# Patient Record
Sex: Male | Born: 1950 | Race: White | Hispanic: No | Marital: Married | Smoking: Never smoker
Health system: Southern US, Community
[De-identification: ages and names within clinical notes are randomized; demographics above are authoritative.]

## PROBLEM LIST (undated history)

## (undated) DIAGNOSIS — M109 Gout, unspecified: Secondary | ICD-10-CM

## (undated) DIAGNOSIS — N281 Cyst of kidney, acquired: Secondary | ICD-10-CM

## (undated) DIAGNOSIS — N401 Enlarged prostate with lower urinary tract symptoms: Secondary | ICD-10-CM

## (undated) DIAGNOSIS — E785 Hyperlipidemia, unspecified: Secondary | ICD-10-CM

## (undated) DIAGNOSIS — I440 Atrioventricular block, first degree: Secondary | ICD-10-CM

## (undated) DIAGNOSIS — I480 Paroxysmal atrial fibrillation: Secondary | ICD-10-CM

## (undated) DIAGNOSIS — I10 Essential (primary) hypertension: Secondary | ICD-10-CM

## (undated) HISTORY — PX: INGUINAL HERNIA REPAIR: SUR1180

## (undated) HISTORY — PX: CARDIAC ELECTROPHYSIOLOGY STUDY AND ABLATION: SHX1294

---
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
## 2013-08-28 ENCOUNTER — Other Ambulatory Visit: Payer: Self-pay | Admitting: Internal Medicine

## 2013-08-28 LAB — BASIC METABOLIC PANEL
Anion Gap: 3 — ABNORMAL LOW (ref 7–16)
Calcium, Total: 9.3 mg/dL (ref 8.5–10.1)
Chloride: 100 mmol/L (ref 98–107)
EGFR (Non-African Amer.): >60
Glucose: 109 mg/dL — ABNORMAL HIGH (ref 65–99)
Osmolality: 277 (ref 275–301)
Potassium: 4.5 mmol/L (ref 3.5–5.1)
Sodium: 137 mmol/L (ref 136–145)

## 2013-08-30 ENCOUNTER — Ambulatory Visit: Payer: Self-pay | Admitting: Internal Medicine

## 2016-03-12 ENCOUNTER — Encounter (INDEPENDENT_AMBULATORY_CARE_PROVIDER_SITE_OTHER): Payer: Self-pay | Admitting: Family Medicine

## 2016-03-12 DIAGNOSIS — Z0289 Encounter for other administrative examinations: Secondary | ICD-10-CM

## 2016-03-12 NOTE — Progress Notes (Unsigned)
FAA class 3 PE

## 2018-03-14 ENCOUNTER — Encounter: Payer: Self-pay | Admitting: Family Medicine

## 2018-03-14 ENCOUNTER — Ambulatory Visit: Payer: Self-pay | Admitting: Family Medicine

## 2018-03-14 VITALS — BP 138/80 | HR 72 | Ht 72.75 in | Wt 195.0 lb

## 2018-03-14 DIAGNOSIS — Z029 Encounter for administrative examinations, unspecified: Secondary | ICD-10-CM

## 2018-03-14 DIAGNOSIS — Z0289 Encounter for other administrative examinations: Secondary | ICD-10-CM

## 2018-03-14 NOTE — Progress Notes (Signed)
   BP 138/80 (BP Location: Left Arm)   Pulse 72   Ht 6' 0.75" (1.848 m)   Wt 195 lb (88.5 kg)   BMI 25.90 kg/m    Subjective:    Patient ID: Marcus Olsen, male    DOB: [birth year], 67 y.o.   MRN: [id number]  HPI: Marcus Olsen is a 67 y.o. male  Chief Complaint  Patient presents with  . Annual Exam    FFA Class 3    Doing well complaints from medications takes without problems good control of blood pressure reviewed notes from Dr. Doy Hutching in [place] with good control. Further atrial fibrillation which is completely resolved with no indications for treatments on cardiology review. Relevant past medical, surgical, family and social history reviewed and updated as indicated. Interim medical history since our last visit reviewed. Allergies and medications reviewed and updated.  Review of Systems  Constitutional: Negative.   HENT: Negative.   Eyes: Negative.   Respiratory: Negative.   Cardiovascular: Negative.   Gastrointestinal: Negative.   Endocrine: Negative.   Genitourinary: Negative.   Musculoskeletal: Negative.   Skin: Negative.   Allergic/Immunologic: Negative.   Neurological: Negative.   Hematological: Negative.   Psychiatric/Behavioral: Negative.     Per HPI unless specifically indicated above     Objective:    BP 138/80 (BP Location: Left Arm)   Pulse 72   Ht 6' 0.75" (1.848 m)   Wt 195 lb (88.5 kg)   BMI 25.90 kg/m   Wt Readings from Last 3 Encounters:  03/14/18 195 lb (88.5 kg)  03/12/16 200 lb (90.7 kg)    Physical Exam  Constitutional: He is oriented to person, place, and time. He appears well-developed and well-nourished.  HENT:  Head: Normocephalic.  Right Ear: External ear normal.  Left Ear: External ear normal.  Nose: Nose normal.  Eyes: Pupils are equal, round, and reactive to light. Conjunctivae and EOM are normal.  Neck: Normal range of motion. Neck supple. No thyromegaly present.  Cardiovascular: Normal rate, regular rhythm, normal  heart sounds and intact distal pulses.  Pulmonary/Chest: Effort normal and breath sounds normal.  Abdominal: Soft. Bowel sounds are normal. There is no splenomegaly or hepatomegaly.  Genitourinary: Penis normal.  Musculoskeletal: Normal range of motion.  Lymphadenopathy:    He has no cervical adenopathy.  Neurological: He is alert and oriented to person, place, and time. He has normal reflexes.  Skin: Skin is warm and dry.  Psychiatric: He has a normal mood and affect. His behavior is normal. Judgment and thought content normal.    No results found for this or any previous visit.    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

## 2018-08-17 ENCOUNTER — Other Ambulatory Visit: Payer: Self-pay

## 2018-08-17 DIAGNOSIS — E782 Mixed hyperlipidemia: Secondary | ICD-10-CM

## 2018-08-17 NOTE — Progress Notes (Signed)
Dr. Nehemiah Massed patient, order placed for Dr. Johnsie Cancel to read.

## 2019-06-14 ENCOUNTER — Telehealth: Payer: Self-pay

## 2019-06-14 ENCOUNTER — Other Ambulatory Visit: Payer: Self-pay

## 2019-06-14 DIAGNOSIS — Z1211 Encounter for screening for malignant neoplasm of colon: Secondary | ICD-10-CM

## 2019-06-14 DIAGNOSIS — Z86010 Personal history of colonic polyps: Secondary | ICD-10-CM

## 2019-06-14 MED ORDER — NA SULFATE-K SULFATE-MG SULF 17.5-3.13-1.6 GM/177ML PO SOLN: 1.0000 | Freq: Once | ORAL | 0 refills | Status: AC

## 2019-06-14 NOTE — Telephone Encounter (Signed)
Gastroenterology Pre-Procedure Review  Request Date: 06/30/19 Requesting Physician: Dr. Allen Norris  PATIENT REVIEW QUESTIONS: The patient responded to the following health history questions as indicated:    1. Are you having any GI issues? no 2. Do you have a personal history of Polyps? yes (5 years ago at [facility]) 3. Do you have a family history of Colon Cancer or Polyps? no 4. Diabetes Mellitus? no 5. Joint replacements in the past 12 months?no 6. Major health problems in the past 3 months?no 7. Any artificial heart valves, MVP, or defibrillator?Patient does have history of AFIB-Cardiac Clearance faxed to Dr. Nehemiah Massed    MEDICATIONS & ALLERGIES:    Patient reports the following regarding taking any anticoagulation/antiplatelet therapy:   Plavix, Coumadin, Eliquis, Xarelto, Lovenox, Pradaxa, Brilinta, or Effient? no Aspirin? no  Patient confirms/reports the following medications:  No current outpatient medications on file.   No current facility-administered medications for this visit.     Patient confirms/reports the following allergies:  Not on File  No orders of the defined types were placed in this encounter.   AUTHORIZATION INFORMATION Primary Insurance: 1D#: Group #:  Secondary Insurance: 1D#: Group #:  SCHEDULE INFORMATION: Date: 06/30/19 Time: Location:[facility]

## 2019-06-26 ENCOUNTER — Telehealth: Payer: Self-pay | Admitting: Gastroenterology

## 2019-06-26 ENCOUNTER — Encounter: Payer: Self-pay | Admitting: *Deleted

## 2019-06-26 ENCOUNTER — Other Ambulatory Visit: Payer: Self-pay

## 2019-06-26 MED ORDER — NA SULFATE-K SULFATE-MG SULF 17.5-3.13-1.6 GM/177ML PO SOLN: 354.0000 mL | Freq: Once | ORAL | 0 refills | Status: AC

## 2019-06-26 NOTE — Progress Notes (Signed)
[facility] states [facility] does not [facility] the [facility] procedure. [facility] [place]

## 2019-06-26 NOTE — Telephone Encounter (Signed)
Suprep rx has been sent to pt's pharmacy.

## 2019-06-26 NOTE — Telephone Encounter (Signed)
Pt wife lefty vm pt has not received his prescription for procedure on Friday please call pt

## 2019-06-27 ENCOUNTER — Other Ambulatory Visit
Admission: RE | Admit: 2019-06-27 | Discharge: 2019-06-27 | Disposition: A | Discharge status: Home / Self Care | Payer: Medicare Other | Source: Ambulatory Visit | Attending: Gastroenterology | Admitting: Gastroenterology

## 2019-06-27 DIAGNOSIS — Z20828 Contact with and (suspected) exposure to other viral communicable diseases: Secondary | ICD-10-CM | POA: Diagnosis not present

## 2019-06-27 DIAGNOSIS — Z01812 Encounter for preprocedural laboratory examination: Secondary | ICD-10-CM | POA: Diagnosis present

## 2019-06-28 ENCOUNTER — Telehealth: Payer: Self-pay

## 2019-06-28 LAB — SARS CORONAVIRUS 2 (TAT 6-24 HRS): SARS Coronavirus 2: NEGATIVE

## 2019-06-28 NOTE — Telephone Encounter (Signed)
Received cardiac clearance for patients colonoscopy scheduled with Dr. Allen Norris on 06/30/19.  Dr. Nehemiah Massed has granted clearance for colonoscopy.  Clearance note to be scanned to patients chart.  Thanks [employer]

## 2019-06-30 ENCOUNTER — Ambulatory Visit: Payer: Medicare Other | Admitting: Anesthesiology

## 2019-06-30 ENCOUNTER — Encounter
Admission: RE | Disposition: A | Discharge status: Home / Self Care | Payer: Self-pay | Source: Home / Self Care | Attending: Gastroenterology

## 2019-06-30 ENCOUNTER — Ambulatory Visit
Admission: RE | Admit: 2019-06-30 | Discharge: 2019-06-30 | Disposition: A | Discharge status: Home / Self Care | Payer: Medicare Other | Attending: Gastroenterology | Admitting: Gastroenterology

## 2019-06-30 DIAGNOSIS — M109 Gout, unspecified: Secondary | ICD-10-CM | POA: Insufficient documentation

## 2019-06-30 DIAGNOSIS — D125 Benign neoplasm of sigmoid colon: Secondary | ICD-10-CM | POA: Diagnosis not present

## 2019-06-30 DIAGNOSIS — E785 Hyperlipidemia, unspecified: Secondary | ICD-10-CM | POA: Insufficient documentation

## 2019-06-30 DIAGNOSIS — N401 Enlarged prostate with lower urinary tract symptoms: Secondary | ICD-10-CM | POA: Diagnosis not present

## 2019-06-30 DIAGNOSIS — Z79899 Other long term (current) drug therapy: Secondary | ICD-10-CM | POA: Insufficient documentation

## 2019-06-30 DIAGNOSIS — I480 Paroxysmal atrial fibrillation: Secondary | ICD-10-CM | POA: Insufficient documentation

## 2019-06-30 DIAGNOSIS — K5730 Diverticulosis of large intestine without perforation or abscess without bleeding: Secondary | ICD-10-CM | POA: Insufficient documentation

## 2019-06-30 DIAGNOSIS — I440 Atrioventricular block, first degree: Secondary | ICD-10-CM | POA: Insufficient documentation

## 2019-06-30 DIAGNOSIS — K640 First degree hemorrhoids: Secondary | ICD-10-CM | POA: Insufficient documentation

## 2019-06-30 DIAGNOSIS — Z860100 Personal history of colon polyps, unspecified: Secondary | ICD-10-CM

## 2019-06-30 DIAGNOSIS — Z7982 Long term (current) use of aspirin: Secondary | ICD-10-CM | POA: Insufficient documentation

## 2019-06-30 DIAGNOSIS — D124 Benign neoplasm of descending colon: Secondary | ICD-10-CM | POA: Insufficient documentation

## 2019-06-30 DIAGNOSIS — Z1211 Encounter for screening for malignant neoplasm of colon: Secondary | ICD-10-CM | POA: Insufficient documentation

## 2019-06-30 DIAGNOSIS — Q6101 Congenital single renal cyst: Secondary | ICD-10-CM | POA: Diagnosis not present

## 2019-06-30 DIAGNOSIS — R351 Nocturia: Secondary | ICD-10-CM | POA: Diagnosis not present

## 2019-06-30 DIAGNOSIS — K635 Polyp of colon: Secondary | ICD-10-CM

## 2019-06-30 DIAGNOSIS — I10 Essential (primary) hypertension: Secondary | ICD-10-CM | POA: Insufficient documentation

## 2019-06-30 HISTORY — PX: POLYPECTOMY: SHX5525

## 2019-06-30 HISTORY — DX: Atrioventricular block, first degree: I44.0

## 2019-06-30 HISTORY — DX: Essential (primary) hypertension: I10

## 2019-06-30 HISTORY — PX: COLONOSCOPY WITH PROPOFOL: SHX5780

## 2019-06-30 HISTORY — DX: Hyperlipidemia, unspecified: E78.5

## 2019-06-30 HISTORY — DX: Cyst of kidney, acquired: N28.1

## 2019-06-30 HISTORY — DX: Paroxysmal atrial fibrillation: I48.0

## 2019-06-30 HISTORY — DX: Benign prostatic hyperplasia with lower urinary tract symptoms: N40.1

## 2019-06-30 HISTORY — DX: Gout, unspecified: M10.9

## 2019-06-30 SURGERY — COLONOSCOPY WITH PROPOFOL
Anesthesia: General | Site: Rectum

## 2019-06-30 MED ORDER — STERILE WATER FOR IRRIGATION IR SOLN
Status: DC | PRN
  Administered 2019-06-30: .05 mL

## 2019-06-30 MED ORDER — LACTATED RINGERS IV SOLN
INTRAVENOUS | Status: DC
  Administered 2019-06-30: 12:00:00 via INTRAVENOUS

## 2019-06-30 MED ORDER — LIDOCAINE HCL (CARDIAC) PF 100 MG/5ML IV SOSY
PREFILLED_SYRINGE | INTRAVENOUS | Status: DC | PRN
  Administered 2019-06-30: 30 mg via INTRAVENOUS

## 2019-06-30 MED ORDER — PROPOFOL 10 MG/ML IV BOLUS
INTRAVENOUS | Status: DC | PRN
  Administered 2019-06-30 (×2): 30 mg via INTRAVENOUS
  Administered 2019-06-30: 20 mg via INTRAVENOUS
  Administered 2019-06-30: 30 mg via INTRAVENOUS
  Administered 2019-06-30: 140 mg via INTRAVENOUS
  Administered 2019-06-30: 30 mg via INTRAVENOUS

## 2019-06-30 SURGICAL SUPPLY — 8 items
CANISTER SUCT 1200ML W/VALVE (MISCELLANEOUS) ×4 IMPLANT
FORCEPS BIOP RAD 4 LRG CAP 4 (CUTTING FORCEPS) ×4 IMPLANT
GOWN CVR UNV OPN BCK APRN NK (MISCELLANEOUS) ×4 IMPLANT
GOWN ISOL THUMB LOOP REG UNIV (MISCELLANEOUS) ×4
KIT ENDO PROCEDURE OLY (KITS) ×4 IMPLANT
SNARE SHORT THROW 13M SML OVAL (MISCELLANEOUS) IMPLANT
TRAP ETRAP POLY (MISCELLANEOUS) IMPLANT
WATER STERILE IRR 250ML POUR (IV SOLUTION) ×4 IMPLANT

## 2019-06-30 NOTE — Anesthesia Procedure Notes (Signed)
Date/Time: 06/30/2019 1:05 PM Performed by: Cameron Ali, CRNA Pre-anesthesia Checklist: Patient identified, Emergency Drugs available, Suction available, Timeout performed and Patient being monitored Patient Re-evaluated:Patient Re-evaluated prior to induction Oxygen Delivery Method: Nasal cannula Placement Confirmation: positive ETCO2

## 2019-06-30 NOTE — Op Note (Signed)
[facility] Gastroenterology Patient Name: Marcus Olsen Procedure Date: 06/30/2019 1:03 PM MRN: [id number] Account #: [id number] Date of Birth: 1951/10/01 Admit Type: Outpatient Age: 68 Room: [facility] OR ROOM 01 Gender: Male Note Status: Finalized Procedure:            Colonoscopy Indications:          High risk colon cancer surveillance: Personal history                        of colonic polyps Providers:            Lucilla Lame MD, MD Referring MD:         Leonie Douglas. Doy Hutching, MD (Referring MD) Medicines:            Propofol per Anesthesia Complications:        No immediate complications. Procedure:            Pre-Anesthesia Assessment:                       - Prior to the procedure, a History and Physical was                        performed, and patient medications and allergies were                        reviewed. The patient's tolerance of previous                        anesthesia was also reviewed. The risks and benefits of                        the procedure and the sedation options and risks were                        discussed with the patient. All questions were                        answered, and informed consent was obtained. Prior                        Anticoagulants: The patient has taken no previous                        anticoagulant or antiplatelet agents. ASA Grade                        Assessment: II - A patient with mild systemic disease.                        After reviewing the risks and benefits, the patient was                        deemed in satisfactory condition to undergo the                        procedure.                       After obtaining informed consent, the colonoscope was  passed under direct vision. Throughout the procedure,                        the patient's blood pressure, pulse, and oxygen                        saturations were monitored continuously. The was   introduced through the anus and advanced to the the                        cecum, identified by appendiceal orifice and ileocecal                        valve. The colonoscopy was performed without                        difficulty. The patient tolerated the procedure well.                        The quality of the bowel preparation was excellent. Findings:      The perianal and digital rectal examinations were normal.      A 4 mm polyp was found in the descending colon. The polyp was sessile.       The polyp was removed with a cold biopsy forceps. Resection and       retrieval were complete.      A 3 mm polyp was found in the sigmoid colon. The polyp was sessile. The       polyp was removed with a cold biopsy forceps. Resection and retrieval       were complete.      Multiple small-mouthed diverticula were found in the entire colon.       Mostly on left.      Non-bleeding internal hemorrhoids were found during retroflexion. The       hemorrhoids were Grade I (internal hemorrhoids that do not prolapse). Impression:           - One 4 mm polyp in the descending colon, removed with                        a cold biopsy forceps. Resected and retrieved.                       - One 3 mm polyp in the sigmoid colon, removed with a                        cold biopsy forceps. Resected and retrieved.                       - Diverticulosis in the entire examined colon.                       - Non-bleeding internal hemorrhoids. Recommendation:       - Discharge patient to home.                       - Resume previous diet.                       - Continue present medications.                       -  Await pathology results.                       - Repeat colonoscopy in 5 years for surveillance. Procedure Code(s):    --- Professional ---                       [phone], Colonoscopy, flexible; with biopsy, single or                        multiple Diagnosis Code(s):    --- Professional ---                        Z86.010, Personal history of colonic polyps                       K63.5, Polyp of colon CPT copyright 2019 American Medical Association. All rights reserved. The codes documented in this report are preliminary and upon coder review may  be revised to meet current compliance requirements. Lucilla Lame MD, MD 06/30/2019 1:23:45 PM This report has been signed electronically. Number of Addenda: 0 Note Initiated On: 06/30/2019 1:03 PM Scope Withdrawal Time: 0 hours 8 minutes 16 seconds  Total Procedure Duration: 0 hours 13 minutes 35 seconds  Estimated Blood Loss: Estimated blood loss: none.      [facility]

## 2019-06-30 NOTE — Transfer of Care (Signed)
Immediate Anesthesia Transfer of Care Note  Patient: Marcus Olsen  Procedure(s) Performed: COLONOSCOPY WITH PROPOFOL (N/A Rectum) POLYPECTOMY (Rectum)  Patient Location: PACU  Anesthesia Type: General  Level of Consciousness: awake, alert  and patient cooperative  Airway and Oxygen Therapy: Patient Spontanous Breathing and Patient connected to supplemental oxygen  Post-op Assessment: Post-op Vital signs reviewed, Patient's Cardiovascular Status Stable, Respiratory Function Stable, Patent Airway and No signs of Nausea or vomiting  Post-op Vital Signs: Reviewed and stable  Complications: No apparent anesthesia complications

## 2019-06-30 NOTE — Anesthesia Postprocedure Evaluation (Signed)
Anesthesia Post Note  Patient: Marcus Olsen [facility]  Procedure(s) Performed: COLONOSCOPY WITH PROPOFOL (N/A Rectum) POLYPECTOMY (Rectum)  Patient location during evaluation: PACU Anesthesia Type: General Level of consciousness: awake and alert Pain management: pain level controlled Vital Signs Assessment: post-procedure vital signs reviewed and stable Respiratory status: spontaneous breathing, nonlabored ventilation, respiratory function stable and patient connected to nasal cannula oxygen Cardiovascular status: blood pressure returned to baseline and stable Postop Assessment: no apparent nausea or vomiting Anesthetic complications: no    Adele Barthel Marcus Olsen

## 2019-06-30 NOTE — Anesthesia Preprocedure Evaluation (Signed)
Anesthesia Evaluation  Patient identified by MRN, date of birth, ID band Patient awake    History of Anesthesia Complications Negative for: history of anesthetic complications  Airway Mallampati: III  TM Distance: >3 FB Neck ROM: Full    Dental no notable dental hx.    Pulmonary neg pulmonary ROS,    Pulmonary exam normal        Cardiovascular hypertension, Normal cardiovascular exam  Hx of afib s/p ablation ~5 years ago, no problems since then   Neuro/Psych negative neurological ROS     GI/Hepatic negative GI ROS, Neg liver ROS,   Endo/Other  negative endocrine ROS  Renal/GU negative Renal ROS     Musculoskeletal negative musculoskeletal ROS (+)   Abdominal   Peds  Hematology negative hematology ROS (+)   Anesthesia Other Findings   Reproductive/Obstetrics                             Anesthesia Physical Anesthesia Plan  ASA: II  Anesthesia Plan: General   Post-op Pain Management:    Induction:   PONV Risk Score and Plan: 2 and Propofol infusion and TIVA  Airway Management Planned: Natural Airway  Additional Equipment:   Intra-op Plan:   Post-operative Plan:   Informed Consent: I have reviewed the patients History and Physical, chart, labs and discussed the procedure including the risks, benefits and alternatives for the proposed anesthesia with the patient or authorized representative who has indicated his/her understanding and acceptance.       Plan Discussed with:   Anesthesia Plan Comments:         Anesthesia Quick Evaluation

## 2019-06-30 NOTE — H&P (Signed)
Marcus Lame, MD [address] [place], [place] [address] [phone] Fax : [phone]  Primary Care Physician:  Idelle Crouch, MD Primary Gastroenterologist:  Dr. Allen Norris  Pre-Procedure History & Physical: HPI:  SHAWNDALE Olsen is a 68 y.o. male is here for an colonoscopy.   Past Medical History:  Diagnosis Date  . AV block, 1st degree   . BPH associated with nocturia   . Gout   . Hyperlipidemia   . Hypertension   . Paroxysmal atrial fibrillation (HCC)   . Renal cyst, left     Past Surgical History:  Procedure Laterality Date  . CARDIAC ELECTROPHYSIOLOGY STUDY AND ABLATION    . INGUINAL HERNIA REPAIR Bilateral    Right - 1974,  left - 2005  . PYLOROMYOTOMY  1954    Prior to Admission medications   Medication Sig Start Date End Date Taking? Authorizing Provider  allopurinol (ZYLOPRIM) 300 MG tablet Take 150 mg by mouth daily.   Yes [provider]  ASPIRIN 81 PO Take by mouth daily.   Yes [provider]  ezetimibe (ZETIA) 10 MG tablet Take 10 mg by mouth daily.   Yes [provider]  lisinopril (ZESTRIL) 20 MG tablet Take 20 mg by mouth daily.   Yes [provider]  Multiple Vitamin (MULTIVITAMIN) tablet Take 1 tablet by mouth daily.   Yes [provider]  sildenafil (VIAGRA) 100 MG tablet Take 100 mg by mouth daily as needed for erectile dysfunction.   Yes [provider]  testosterone cypionate (DEPO-TESTOSTERONE) 200 MG/ML injection Inject into the muscle every 28 (twenty-eight) days.   Yes [provider]  UBIQUINOL PO Take 10 mg by mouth daily.   Yes [provider]  benzonatate (TESSALON) 200 MG capsule Take 200 mg by mouth 3 (three) times daily as needed for cough.    [provider]  valACYclovir (VALTREX) 1000 MG tablet Take 1,000 mg by mouth 2 (two) times daily as needed.    [provider]    Allergies as of 06/14/2019  . (Not on File)     History reviewed. No pertinent family history.  Social History   Socioeconomic History  . Marital status: Married    Spouse name: Not on file  . Number of children: Not on file  . Years of education: Not on file  . Highest education level: Not on file  Occupational History  . Not on file  Social Needs  . Financial resource strain: Not on file  . Food insecurity    Worry: Not on file    Inability: Not on file  . Transportation needs    Medical: Not on file    Non-medical: Not on file  Tobacco Use  . Smoking status: Never Smoker  . Smokeless tobacco: Never Used  Substance and Sexual Activity  . Alcohol use: Yes    Alcohol/week: 24.0 standard drinks    Types: 24 Cans of beer per week  . Drug use: Not on file  . Sexual activity: Not on file  Lifestyle  . Physical activity    Days per week: Not on file    Minutes per session: Not on file  . Stress: Not on file  Relationships  . Social Herbalist on phone: Not on file    Gets together: Not on file    Attends religious service: Not on file    Active member of club or organization: Not on file  Attends meetings of clubs or organizations: Not on file    Relationship status: Not on file  . Intimate partner violence    Fear of current or ex partner: Not on file    Emotionally abused: Not on file    Physically abused: Not on file    Forced sexual activity: Not on file  Other Topics Concern  . Not on file  Social History Narrative  . Not on file    Review of Systems: See HPI, otherwise negative ROS  Physical Exam: BP (!) 154/95   Pulse 78   Temp 97.9 F (36.6 C) (Temporal)   Resp 16   Ht 6\' 2"  (1.88 m)   Wt 85.3 kg   SpO2 100%   BMI 24.14 kg/m  General:   Alert,  pleasant and cooperative in NAD Head:  Normocephalic and atraumatic. Neck:  Supple; no masses or thyromegaly. Lungs:  Clear throughout to auscultation.    Heart:  Regular rate and rhythm. Abdomen:  Soft, nontender and nondistended.  Normal bowel sounds, without guarding, and without rebound.   Neurologic:  Alert and  oriented x4;  grossly normal neurologically.  Impression/Plan: ELDREDGE KLUTTZ is here for an colonoscopy to be performed for history of adenomatous polyps 08/2014  Risks, benefits, limitations, and alternatives regarding  colonoscopy have been reviewed with the patient.  Questions have been answered.  All parties agreeable.   Marcus Lame, MD  06/30/2019, 11:56 AM

## 2019-07-03 ENCOUNTER — Encounter: Payer: Self-pay | Admitting: Gastroenterology

## 2019-07-04 ENCOUNTER — Encounter: Payer: Self-pay | Admitting: Gastroenterology

## 2020-06-04 ENCOUNTER — Other Ambulatory Visit: Payer: Self-pay

## 2020-06-04 ENCOUNTER — Other Ambulatory Visit: Payer: Self-pay | Admitting: Internal Medicine

## 2020-06-04 ENCOUNTER — Ambulatory Visit
Admission: RE | Admit: 2020-06-04 | Discharge: 2020-06-04 | Disposition: A | Discharge status: Home / Self Care | Payer: Medicare Other | Source: Ambulatory Visit | Attending: Internal Medicine | Admitting: Internal Medicine

## 2020-06-04 DIAGNOSIS — J1282 Pneumonia due to coronavirus disease 2019: Secondary | ICD-10-CM | POA: Insufficient documentation

## 2020-06-04 DIAGNOSIS — U071 COVID-19: Secondary | ICD-10-CM | POA: Insufficient documentation

## 2020-07-01 ENCOUNTER — Other Ambulatory Visit: Payer: Self-pay | Admitting: Urology

## 2020-07-01 DIAGNOSIS — D3000 Benign neoplasm of unspecified kidney: Secondary | ICD-10-CM

## 2020-07-05 ENCOUNTER — Ambulatory Visit (HOSPITAL_COMMUNITY)
Admission: RE | Admit: 2020-07-05 | Discharge: 2020-07-05 | Disposition: A | Discharge status: Home / Self Care | Payer: Medicare Other | Source: Ambulatory Visit | Attending: Urology | Admitting: Urology

## 2020-07-05 DIAGNOSIS — D3000 Benign neoplasm of unspecified kidney: Secondary | ICD-10-CM | POA: Diagnosis present

## 2020-07-05 MED ORDER — GADOBUTROL 1 MMOL/ML IV SOLN
8.0000 mL | Freq: Once | INTRAVENOUS | Status: AC | PRN
  Administered 2020-07-05: 8 mL via INTRAVENOUS

## 2020-07-16 ENCOUNTER — Ambulatory Visit: Payer: Medicare Other

## 2022-06-03 IMAGING — CT CT CHEST W/O CM
2 of 4 series · 15 of 36 positions shown, 18 images · non-contrast
Comparison: Outside chest x-rays not available.

CLINICAL DATA: Worsening shortness of breath and productive cough
for 5 days. Positive for COVID on 05/20/2020.

EXAM:
CT CHEST WITHOUT CONTRAST
TECHNIQUE: Multidetector CT imaging of the chest was performed following the
standard protocol without IV contrast.

[Series 2: chest 2.00 · axial · 0.62mm/px · z∈[-1221,-921]mm · 12 of 178 slices shown, 15 images]
[im 14/178  mediastinal]
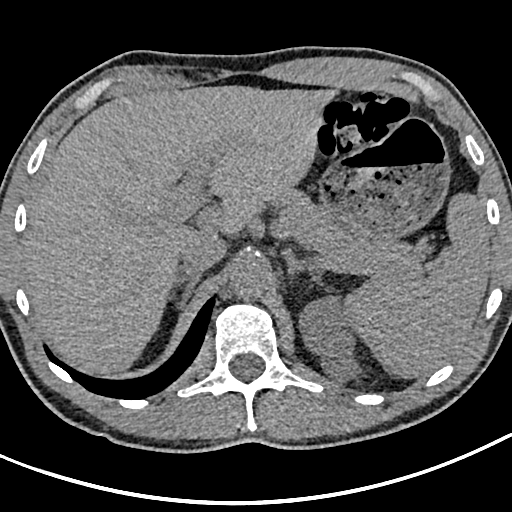
[im 14/178  lung]
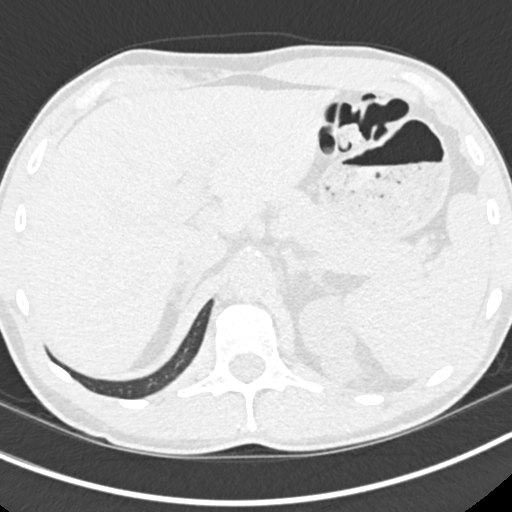
[im 28/178  lung]
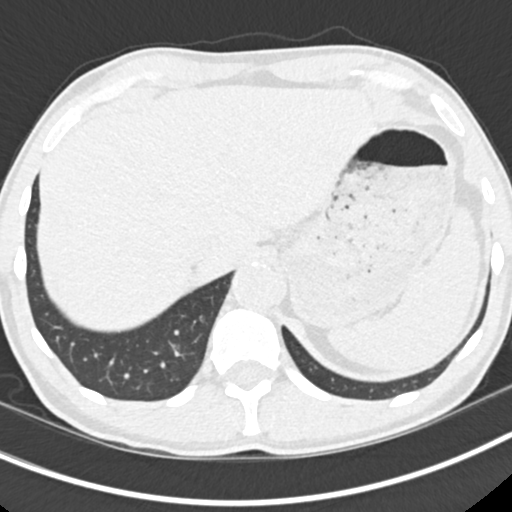
[im 41/178  lung]
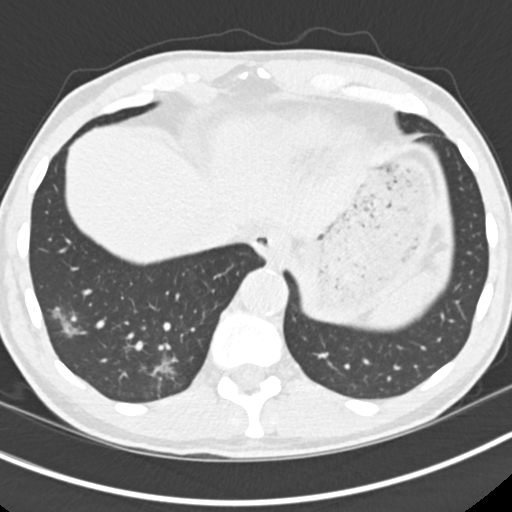
[im 55/178  lung]
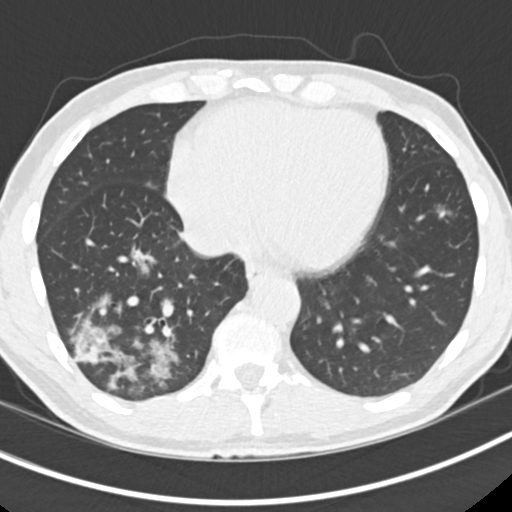
[im 69/178  mediastinal]
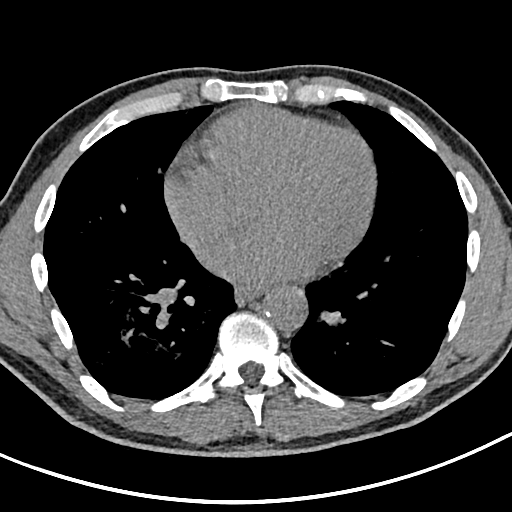
[im 69/178  lung]
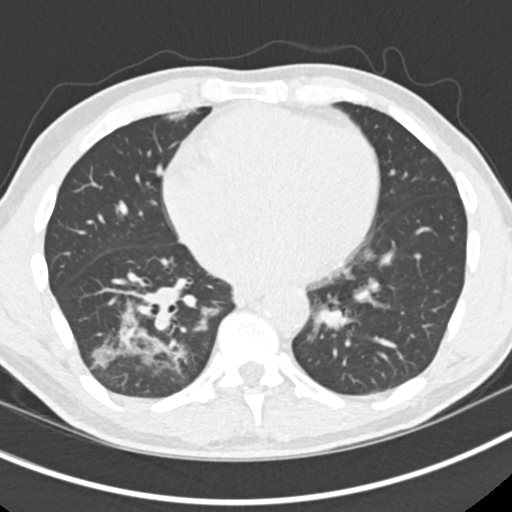
[im 82/178  lung]
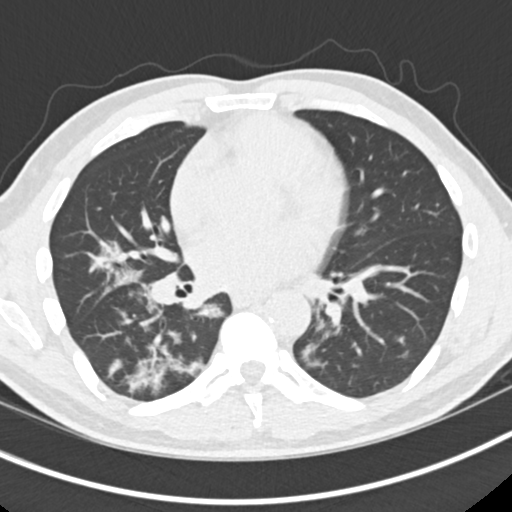
[im 96/178  lung]
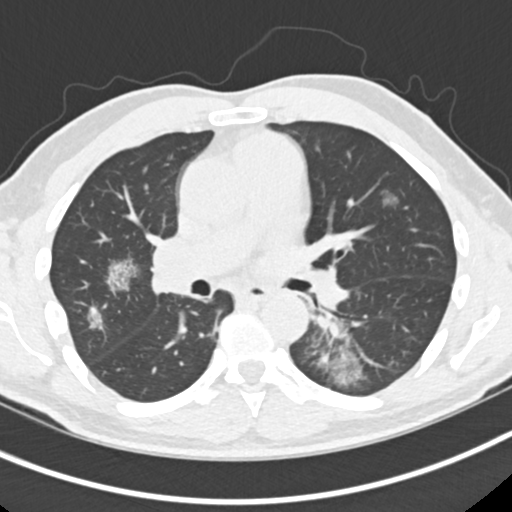
[im 109/178  lung]
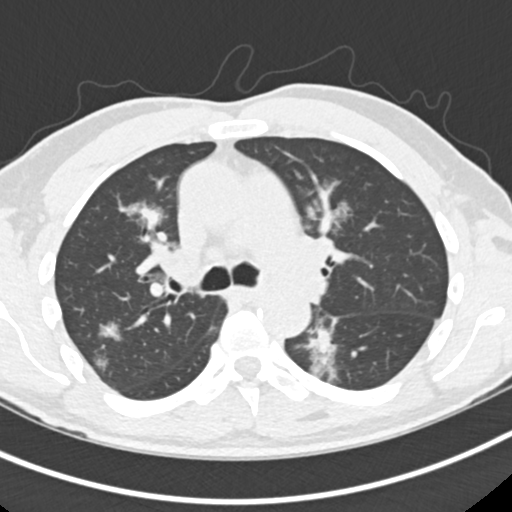
[im 123/178  mediastinal]
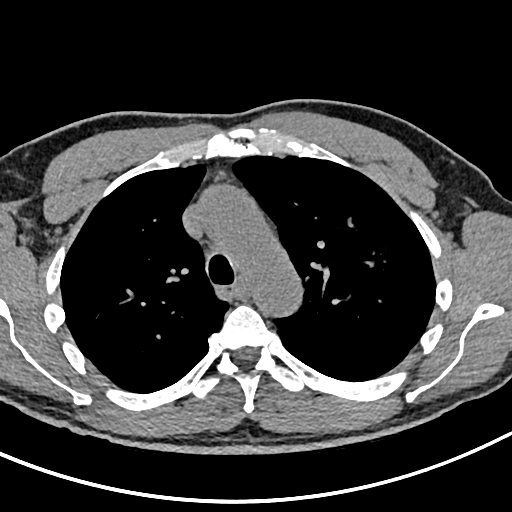
[im 123/178  lung]
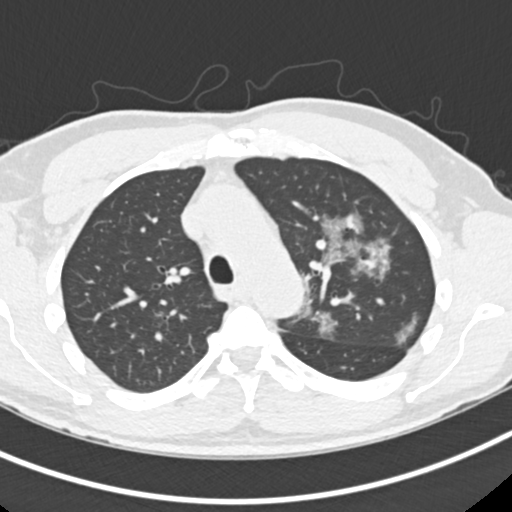
[im 137/178  lung]
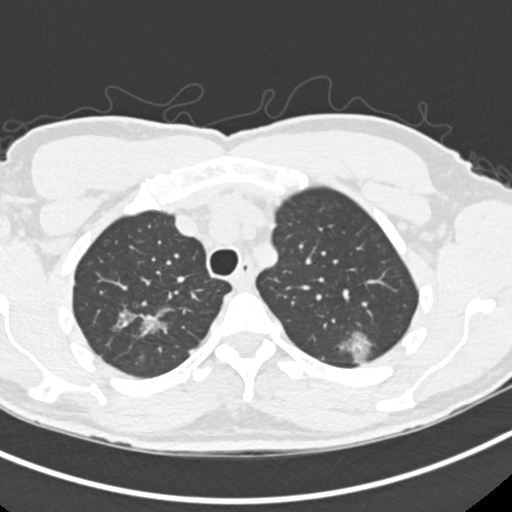
[im 150/178  lung]
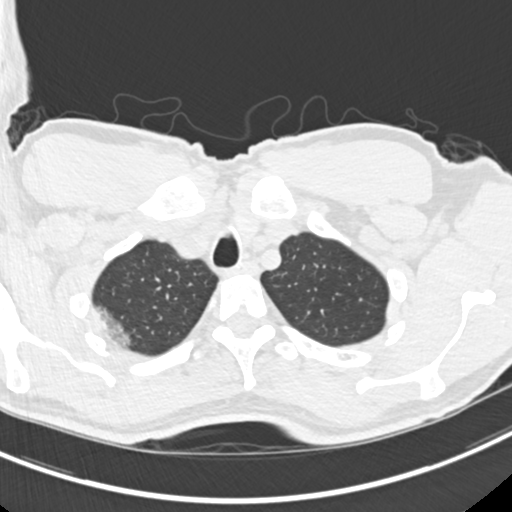
[im 164/178  lung]
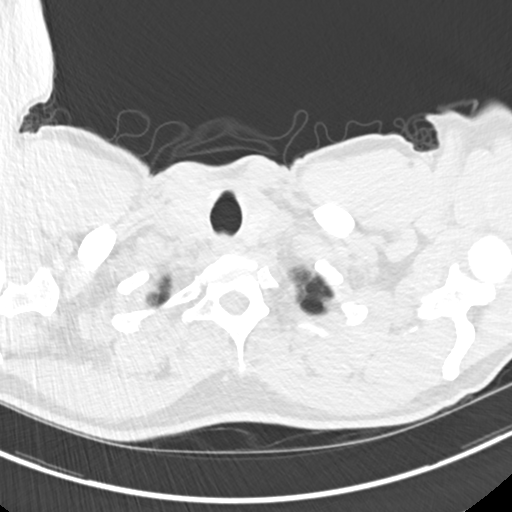

[Series 5: coronals chest 2.00 cor · coronal · 0.62mm/px · 3 of 126 slices shown]
[im 26/126  lung]
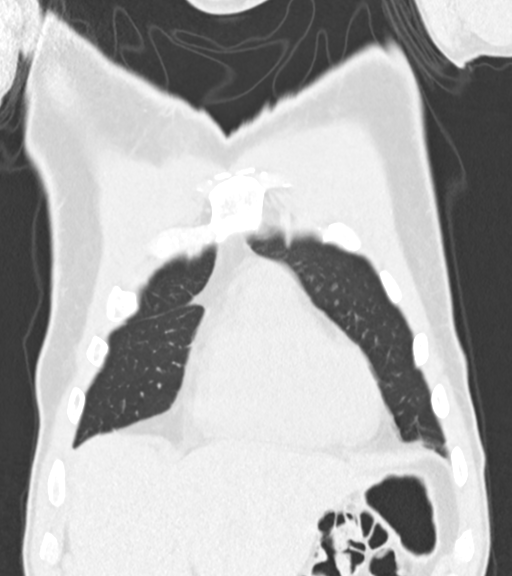
[im 51/126  lung]
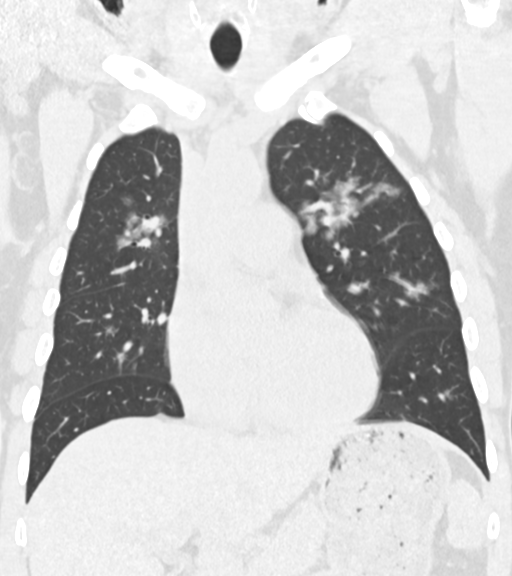
[im 76/126  lung]
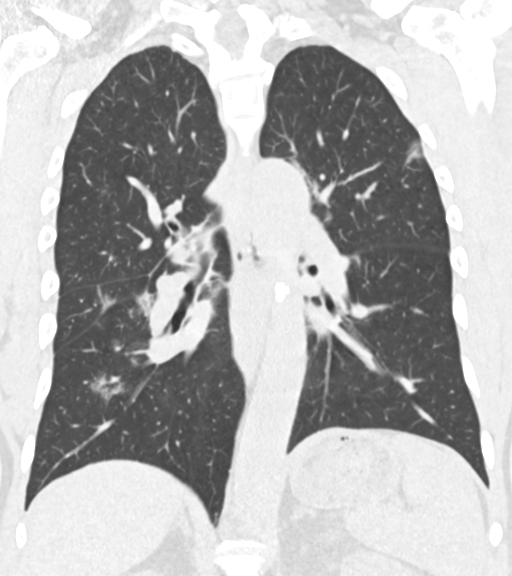

[15 of 36 positions shown; findings below may reference images not displayed]

FINDINGS: Cardiovascular: No thoracic aortic aneurysm. Scattered aortic
atherosclerosis. No pericardial effusion. Scattered coronary artery
calcifications.

Mediastinum/Nodes: No mass or enlarged lymph nodes are seen within
the mediastinum. Esophagus is unremarkable. Trachea and central
bronchi are unremarkable.

Lungs/Pleura: Patchy, somewhat nodular, ground-glass opacities
scattered throughout both lungs, involving all lobes, most confluent
within the RIGHT lower lobe and LEFT upper lobe. No pleural
effusion.

Upper Abdomen: Large solid-appearing mass exophytic to the upper
pole of the LEFT kidney, measuring at least 5.1 cm greatest
dimension. There may be a second mass slightly lower within the LEFT
kidney. LEFT kidney is incompletely imaged.

Musculoskeletal: No acute or suspicious osseous finding.
IMPRESSION: 1. Patchy ground-glass opacities scattered throughout both lungs,
involving all lobes, most confluent within the RIGHT lower lobe and
LEFT upper lobe. Findings are compatible with multifocal pneumonia,
most likely [id number] pneumonia.
2. **An incidental finding of potential clinical significance has
been found. Large solid-appearing mass exophytic to the upper pole
of the LEFT kidney, measuring at least 5.1 cm greatest dimension,
suspicious for renal cell carcinoma. There may be a second mass
slightly lower within the LEFT kidney. LEFT kidney is incompletely
imaged. Recommend further characterization with nonemergent renal
protocol CT or MRI.**
3. Coronary artery calcifications.

Aortic Atherosclerosis (Q84LB-PV1.1).

These results will be called to the ordering clinician or
representative by the Radiologist Assistant, and communication
documented in the PACS or [REDACTED].

## 2022-07-28 ENCOUNTER — Other Ambulatory Visit: Payer: Self-pay | Admitting: Urology

## 2024-10-09 ENCOUNTER — Other Ambulatory Visit: Payer: Self-pay | Admitting: Specialist

## 2024-10-09 DIAGNOSIS — R911 Solitary pulmonary nodule: Secondary | ICD-10-CM

## 2024-10-09 DIAGNOSIS — C61 Malignant neoplasm of prostate: Secondary | ICD-10-CM

## 2024-10-16 ENCOUNTER — Encounter: Payer: Self-pay | Admitting: Internal Medicine

## 2024-10-31 NOTE — Progress Notes (Signed)
 Subjective:    Chief Complaint  Patient presents with   Evaluation    Umbilical hernia (no obstruction or gangrene) Patient has noticed umbilical protrusions recently PMH - 4 laparoscopic surgeries - concern for adhesions    History of Present Illness: Marcus Olsen is an 74 y.o. male who is seen at the request of Donnice Guadalupe Pais, MD for evaluation of umbilical bulge, moderate. Symptoms were first noted 2 years ago and are unchanged.  Pain is minimal to none. Lump is present and reducible.  Symptoms are not lifestyle/occupation limiting.  He is still able to do his full exercise routine.  No family was present for this visit.  Positive family history of hernia.  Negative history of tobacco use.  Positive history of exertion during occupation/exercise.  Past Medical History[1]  Problem List[2]   Past Surgical History[3]  Family History[4]  Social History[5]  Current Medications[6]  Allergies[7]  The following portions of the patient's history were reviewed and updated as appropriate: medications, allergies, past medical history, social history, surgical history, current problems.  I have personally reviewed all outside records supplied regarding the patient.    Review of Systems:  A full review of systems is noted below or is otherwise negative other than what is mentioned in the HPI.  negative except for items noted below. Constitution:     Constitutional: all negative.  Eyes:    Endocrine: Endocrine: all negative.  Allergy/Immun:    HENT:     Respiratory:     Respiratory: all negative.  Cardiovascular: Cardiology: all negative.  GI: GI: all negative.  GU: GU: negative except for items noted below. (Patient reports some stress incontinence)  Musc: Musculoskeletal: negative except for items noted below. Arthralgias: (!) Yes  Skin: Skin: all negative.  Neurological: Neurological: all negative.  Hematologic: Hematological: all negative.   Psychiatric: Psychiatric: negative except for items noted below. Depression: Yes (Sometimes - trialing injections for erectile dysfunction)    Objective:   Vitals:   10/31/24 1159  BP: 140/65  BP Location: Left upper arm  BP Position: Sitting  Cuff Size: Adult (Navy Blue)  Pulse: 89  Temp: 98.6 F (37 C)  TempSrc: Temporal  SpO2: 97%  Weight: 92.6 kg (204 lb 1.6 oz)  Height: 1.88 m (6' 2)     Physical Exam Vitals reviewed.  Constitutional:      General: He is not in acute distress.    Appearance: He is well-developed.  HENT:     Head: Normocephalic and atraumatic.  Eyes:     General: No scleral icterus. Cardiovascular:     Rate and Rhythm: Normal rate.  Pulmonary:     Effort: Pulmonary effort is normal.  Abdominal:     General: There is no distension.     Palpations: Abdomen is soft.     Tenderness: There is no abdominal tenderness.     Hernia: A hernia is present.     Comments: Umbilical/incisional hernia, 3 cm defect, reducible  Musculoskeletal:        General: No deformity.     Cervical back: Normal range of motion.  Skin:    General: Skin is warm and dry.  Neurological:     Mental Status: He is alert and oriented to person, place, and time. Mental status is at baseline.  Psychiatric:        Behavior: Behavior normal.        Thought Content: Thought content normal.        Judgment: Judgment  normal.      Review of Data: I have personally reviewed recent laboratory results, reviewed prior records available in Epic, and directly visualized recent radiology images and correlated them with the radiologist's report.  My interpretation of pertinent images can be found in the history of present illness when applicable.    Assessment:     Umbilical hernia without obstruction and without gangrene  (primary encounter diagnosis)      Plan:      The patient favors observation as the symptoms are not lifestyle limiting at this time.    I reviewed  hernia repair techniques and associated risks including but not limited to bleeding, infection, perioperative cardiopulmonary events, hernia recurrence, chronic postoperative pain, or other unforeseen complications.    I discussed signs and symptoms of incarceration and/or strangulation and stressed the importance of seeking medical attention for these symptoms.    I advised the patient that the hernia may either stay the same size or enlarge with time and that either course is difficult to predict.    The patient will follow-up in 6 months or sooner as needed for any change in symptoms.           [1] Past Medical History: Diagnosis Date   Anemia    BPH (benign prostatic hyperplasia)    Congenital pyloric stenosis (CMS/HCC v28)    Diverticulitis large intestine    Erectile dysfunction    First degree AV block    GERD (gastroesophageal reflux disease)    Gouty arthropathy    High cholesterol    Hypertension    Low testosterone    Paroxysmal A-fib (CMS/HCC v28)    Renal cyst    Right varicocele    Subluxation of extensor carpi ulnaris tendon    TFCC (triangular fibrocartilage complex) injury, left, initial encounter   [2] Patient Active Problem List Diagnosis   Prostate cancer (CMS/HCC v28)   Male stress incontinence   ED (erectile dysfunction) of organic origin  [3] Past Surgical History: Procedure Laterality Date   BASAL CELL CARCINOMA EXCISION     left eyelid x9   CARDIAC ELECTROPHYSIOLOGY STUDY AND ABLATION  2013   CATARACT EXTRACTION Right 2024   with new lens   COLECTOMY  2022   sigmoid   HERNIA REPAIR Bilateral    3 surgeries   LIPOMA RESECTION     PROSTATECTOMY  01/27/2023   ROTATOR CUFF REPAIR     TENDON REPAIR Right 2022  [4] History reviewed. No pertinent family history. [5] Social History Tobacco Use   Smoking status: Never   Smokeless tobacco: Never  Vaping Use   Vaping status: Never Used  Substance Use Topics    Alcohol use: Yes    Alcohol/week: 3.0 standard drinks of alcohol    Types: 3 Cans of beer per week   Drug use: Yes    Frequency: 1.5 times per week    Comment: 300 mg Delta 8 THC gummies (OTC)  [6] Current Outpatient Medications  Medication Sig Dispense Refill   alfuzosin ER (UROXATRAL) 10 mg 24 hr tablet Take 1 tablet (10 mg total) by mouth daily.     allopurinoL (ZYLOPRIM) 300 MG tablet Take 1 tablet (300 mg total) by mouth daily.     aspirin 81 MG tablet Take 1 tablet (81 mg total) by mouth daily.     astaxanthin 12 mg cap Take 6 mg by mouth.     budesonide (PULMICORT) 0.5 mg/2 mL nebulizer solution Inhale 2 mL (0.5 mg  total) daily as needed.     cholecalciferol, vitamin D3, 50 mcg (2,000 unit) cap Take 5,000 Units by mouth daily.     COQ10, UBIQUINOL, ORAL Take 10 mg by mouth daily.     cyanocobalamin (VITAMIN B12) 1000 MCG tablet Take 1 tablet (1,000 mcg total) by mouth.     fexofenadine (ALLEGRA) 180 MG tablet Take 1 tablet (180 mg total) by mouth daily as needed.     lisinopriL (ZESTRIL) 10 MG tablet TAKE 1&1/2 TABLET BY MOUTH IN THE MORNING & 1/2 TABLET AT NIGHT     omega-3 acid ethyl esters (LOVAZA) 1 gram capsule Take 2 capsules (2 g total) by mouth 2 (two) times a day.     pantoprazole (PROTONIX) 40 MG tablet TAKE 1 TABLET (40 MG TOTAL) BY MOUTH ONCE DAILY FOR 180 DAYS PLEASE TAKE THE PROTONIX AT BEDTIME     tadalafiL (CIALIS) 5 MG tablet Take 1 tablet (5 mg total) by mouth daily.     testosterone cypionate (DEPO-TESTOSTERONE) 200 mg/mL injection Inject into the thigh.     lisinopriL (ZESTRIL) 20 MG tablet Take 1 tablet (20 mg total) by mouth daily. (Patient not taking: Reported on 10/31/2024)     tamsulosin (FLOMAX) 0.4 mg cap TAKE 1 CAPSULE (0.4 MG TOTAL) BY MOUTH ONCE DAILY 30 MINUTES AFTER SAME MEAL EACH DAY (Patient not taking: Reported on 10/31/2024)     No current facility-administered medications for this visit.  [7] Allergies Allergen Reactions    Iodinated Contrast Media Swelling   Oxycodone Other (See Comments)   Shellfish Containing Products Swelling   Diltiazem Dermatitis and Other (See Comments)    Muscle and joint aches

## 2024-11-07 ENCOUNTER — Ambulatory Visit
Admission: RE | Admit: 2024-11-07 | Discharge: 2024-11-07 | Disposition: A | Discharge status: Home / Self Care | Source: Ambulatory Visit | Attending: Specialist | Admitting: Specialist

## 2024-11-07 DIAGNOSIS — C61 Malignant neoplasm of prostate: Secondary | ICD-10-CM | POA: Diagnosis present

## 2024-11-07 DIAGNOSIS — R911 Solitary pulmonary nodule: Secondary | ICD-10-CM | POA: Diagnosis present

## 2024-11-07 NOTE — Progress Notes (Signed)
 Chief Complaint  Patient presents with   Follow-up    Still sick no change. Battling this since Christmas off and on. States his ears are hurting and still coughing.     HPI  Marcus Olsen is a 74 y.o. here for an acute issue.  He has a PMH of A. fib, S/P ablation, HTN, gout, HLD, prostate cancer/prostatectomy, COVID who presents with persistent URI symptoms, mostly head and sinus congestion but also ear pain now on the right side.  His cough and chest congestion has improved.  General malaise and fatigue still.    ROS  Pertinent items are noted in HPI.  Outpatient Encounter Medications as of 11/07/2024  Medication Sig Dispense Refill   albuterol MDI, PROVENTIL, VENTOLIN, PROAIR, HFA (VENTOLIN HFA) 90 mcg/actuation inhaler Inhale 2 inhalations into the lungs every 6 (six) hours as needed for Wheezing 18 each 1   alfuzosin (UROXATRAL) 10 mg ER tablet Take 1 tablet (10 mg total) by mouth once daily 90 tablet 1   allopurinol (ZYLOPRIM) 300 MG tablet TAKE 1 TABLET BY MOUTH EVERY DAY 90 tablet 3   aspirin 81 MG EC tablet Take 81 mg by mouth every morning        astaxanthin 12 mg Cap Take 6 mg by mouth 2 (two) times daily     budesonide (PULMICORT) 0.5 mg/2 mL nebulizer solution Take 2 mLs (0.5 mg total) by nebulization once daily 60 mL 1   cholecalciferol (VITAMIN D3) 2,000 unit capsule Take 5,000 Units by mouth once daily     COQ10, UBIQUINOL, ORAL Take 10 mg by mouth every morning        cyanocobalamin (VITAMIN B12) 1000 MCG tablet Take 1,000 mcg by mouth 2 (two) times daily     ePHEDrine HCL (PRIMATENE) 12.5 mg Tab Take by mouth once daily as needed     fexofenadine (ALLEGRA) 180 MG tablet Take 180 mg by mouth once daily     Herbal Supplement Herbal Name: EverStrong Blue     Herbal Supplement Herbal Name: Ultra Potent-C     Herbal Supplement Herbal Name: Ultimate H.A.     Herbal Supplement Herbal Name: Vitamins Only     ipratropium-albuteroL (DUO-NEB) nebulizer  solution Take 3 mLs by nebulization 4 (four) times daily as needed for Wheezing for up to 60 days 360 mL 1   lisinopriL (ZESTRIL) 10 MG tablet TAKE 1&1/2 TABLET BY MOUTH IN THE MORNING & 1/2 TABLET AT NIGHT (Patient taking differently: Take 10 mg by mouth 2 (two) times daily TAKE 1&1/2 TABLET BY MOUTH IN THE MORNING & 1/2 TABLET AT NIGHT) 180 tablet 1   MULTIVITAMIN ORAL Take 1 tablet by mouth once daily.     omega-3 acid ethyl esters (LOVAZA) 1 gram capsule Take 2 g by mouth 2 (two) times daily     pantoprazole (PROTONIX) 40 MG DR tablet TAKE 1 TABLET (40 MG TOTAL) BY MOUTH ONCE DAILY FOR 180 DAYS PLEASE TAKE THE PROTONIX AT BEDTIME 90 tablet 3   predniSONE (DELTASONE) 20 MG tablet Take 2 tablets by mouth daily for 5 days followed by 1 tablet by mouth daily for 5 days and stop 15 tablet 0   sennosides-docusate (SENOKOT-S) 8.6-50 mg tablet Take 2 tablets by mouth 2 (two) times daily 40 tablet 0   syringe with needle 3 mL 22 gauge x 1 Syrg Use 1 each every 14 (fourteen) days 12 Syringe 1   gabapentin (NEURONTIN) 300 MG capsule Take 1 capsule (300 mg total) by  mouth at bedtime for 90 days     tadalafiL (CIALIS) 5 MG tablet Take 1 tablet (5 mg total) by mouth once daily 30 tablet 11   No facility-administered encounter medications on file as of 11/07/2024.    Allergies as of 11/07/2024 - Reviewed 11/07/2024  Allergen Reaction Noted   Iodine and iodide containing products Swelling    Shellfish containing products Swelling 11/03/2013   Codeine Palpitations    Diltiazem Dermatitis 02/16/2014    Past Medical History:  Diagnosis Date   A-fib (CMS/HHS-HCC)    Allergy 02/09/2022   Anemia 06/04/2020   Atrial fibrillation (CMS/HHS-HCC)    Atrial flutter (CMS/HHS-HCC) 11/03/2013   Benign essential hypertension 02/01/2015   BPH associated with nocturia 11/07/2014   Cancer (CMS/HHS-HCC) 11/12/2022   COVID-19 04/2020   Diverticulitis large intestine 07/08/2015   ED (erectile  dysfunction) 06/29/2022   Elevated PSA 06/29/2022   Erectile dysfunction due to arterial insufficiency 11/07/2014   Esophageal reflux 02/16/2014   First degree AV block 02/14/2019   Gout 11/03/2013   Gouty arthropathy    Heart disease AFIB/ ablasion correct   History of cataract 07/31/2022   History of colonic polyps 08/01/2013   Left renal cysts (2) 11/07/2014   Lower urinary tract symptoms (LUTS) 06/29/2022   Melanoma (CMS/HHS-HCC)    Mixed hyperlipidemia 02/16/2014   Paroxysmal A-fib (CMS/HHS-HCC) 09/26/2014   Sp ablation without recurrance    Paroxysmal A-fib (CMS/HHS-HCC)    Right varicocele 11/07/2014   TFCC (triangular fibrocartilage complex) injury, left, initial encounter     Past Surgical History:  Procedure Laterality Date   PYLOROMYOTOMY  1954   INGUINAL HERNIA REPAIR Right 1974   INGUINAL HERNIA REPAIR Left 2005   partial removal of large intestine  12/06/2019   ARTHROSCOPY SHOULDER W/DEBRIDEMENT Right 08/29/2020   Procedure: RIGHT ARTHROSCOPY, SHOULDER, DIAGNOSTIC;  Surgeon: Curvin Donald Macintosh, MD;  Location: [facility];  Service: Orthopedics;  Laterality: Right;   REMOVAL HARDWARE SHOULDER Right 08/29/2020   Procedure: RIGHT REMOVAL OF IMPLANT;  Surgeon: Curvin Donald Macintosh, MD;  Location: [facility];  Service: Orthopedics;  Laterality: Right;   ARTHROSCOPY SHOULDER W/DEBRIDEMENT Right 01/23/2021   Procedure: Right ARTHROSCOPY, SHOULDER DEBRIDEMENT;  Surgeon: Curvin Donald Macintosh, MD;  Location: [facility];  Service: Orthopedics;  Laterality: Right;   REMOVAL HARDWARE SHOULDER Right 01/23/2021   Procedure: REMOVAL OF IMPLANT; SHOULDER, DEEP (EG, BURIED WIRE, PIN, SCREW, METAL BAND, NAIL, ROD OR PLATE);  Surgeon: Curvin Donald Macintosh, MD;  Location: [facility];  Service: Orthopedics;  Laterality: Right;   ROBOT ASSISTED LAPAROSCOPIC PROSTATECTOMY, RETROPUBIC RADICAL, INCLUDING NERVE SPARING N/A 01/27/2023   Procedure: ROBOT ASSISTED LAPAROSCOPY,  SURGICAL; PROSTATECTOMY, RETROPUBIC RADICAL, INCLUDING NERVESPARING;  Surgeon: Ivery Ozell Pride, MD;  Location: [facility];  Service: Urology;  Laterality: N/A;   LAPAROSCOPIC LYMPHADENECTOMY PELVIC Bilateral 01/27/2023   Procedure: LAPAROSCOPY, SURGICAL; WITH BILATERAL TOTAL PELVIC LYMPHADENECTOMY;  Surgeon: Ivery Ozell Pride, MD;  Location: [facility];  Service: Urology;  Laterality: Bilateral;   cardiac ablation N/A    CATARACT EXTRACTION  09/17/2022   COLON SURGERY  07/27/2019   EXPLORATORY LAPAROTOMY  2021   FRACTURE SURGERY  2009   PROSTATE SURGERY  09/14/2022   SINUS SURGERY  1989    Vitals:   11/07/24 1120  BP: (!) 140/90  Pulse: 86    Physical Exam  General. Well appearing; NAD; VS reviewed     HEENT: Sclera and conjunctiva clear; EOMI, Ears:  Clear with nml landmarks on the left with hemotympanum.  Neck. Supple.  Lungs. Respirations unlabored;  Heart: RRR Extremities:  No edema. Skin. Normal color and turgor Neurologic. Alert and oriented x3   Assessment and Plan 1. Acute right otitis media  -     doxycycline (VIBRA-TABS) 100 MG tablet; Take 1 tablet (100 mg total) by mouth 2 (two) times daily for 10 days -     predniSONE (DELTASONE) 10 MG tablet; 6 tabs x 2 day, 5 tabs x 2 day, 4 tabs x 2 day, etc... -     Ambulatory Referral to Otolaryngology  2. Acute frontal sinusitis, recurrence not specified Per #1    I have personally performed this service.  [address] [place], [place]
